# Patient Record
Sex: Male | Born: 1965
Health system: Western US, Academic
[De-identification: ages and names within clinical notes are randomized; demographics above are authoritative.]

---

## 2018-07-04 ENCOUNTER — Telehealth: Payer: Self-pay | Admitting: Internal Medicine

## 2018-07-04 DIAGNOSIS — Z524 Kidney donor: Principal | ICD-10-CM

## 2018-07-04 NOTE — Telephone Encounter (Signed)
Do you have a primary care doctor?   No   Do you have medical insurance?   No   When was your last physical examination?   couple of years   If you are over the age of 27, what is the approximate year of your last colonoscopy?   couple of years   Do you currently smoke?   No   If no, have you ever smoked?   Yes   If Yes, how much?   2 cigarrettes   How long?   long time ago   When did you quit?     *Do you take any prescribed medications every day including psychiatric medications? (Do not include vitamins)   No   If Yes please list your medications.     If you take medication for high blood pressure how long have you been taking it?     *Are you currently under the care of a doctor for any medical or psychiatric conditions?   No   If yes list the conditions followed by your doctor(s).     *Do you have diabetes?   No   *Do you currently smoke or chew tobacco?   No   *Have you ever had a blood clot in your legs or lungs?   No   *Have you ever had cancer including skin cancer?   No   If yes what type?     *Have you ever been diagnosed with hepatitis C?   No   *Have you ever had a stroke?   No   *Have you ever been admitted to the hospital for more than one night? (Please do not include childbirth)   Yes   Please list your hospital admissions   [facility] food poisoning.   *Are you allergic to iodine?   No   *Do you take protein supplements?   No   *Do you have sleep apnea or ever prescribed a CPAP machine?   No   What is your blood type?   I dont know   *Which country were you born in?   [place]   *Have you traveled to or lived in Public affairs [place] or [place] more than a short vacation as a tourist?   No   *Have you been to rural areas in [place] / Careers adviser or [place]?   No   *Have you had a pregnancy in the last 12 months?   No     Family History  Diabetes   No   High Blood Pressure   Yes   If yes, who?   mom   Kidney Disease   No   Bleeding Problems   No   Clotting Problems   No

## 2018-07-04 NOTE — Telephone Encounter (Signed)
Recipient is financially cleared per RN Evangeline Dakin ok to start donor testing, message Rivka [name] for initial ILDA call.

## 2018-07-06 NOTE — Telephone Encounter (Signed)
Initial ILDA interview    Per return call from patient, explained to donor that the ILDA function independently from the transplant candidate's team and that the role of Independent Living Donor Advocate Claudia Pollock) is to make sure that living donation is in your best interest and that you are making an informed choice about becoming a living donor.     Pt states he is interested in donating to a friend. Pt has known recipient for about 2 years.  He is only interested in being direct donor for recipient.  He has limited information about living donation.    Potential Donor states that decision to donate is voluntary and is aware that they can opt out and change their mind about decision to donate at any time.  Denies feeling any pressure or coercion.  Donor was informed that information remains confidential.  Donor is aware that recipient has other treatment options including dialysis or deceased donor transplantation.  Donor was informed about options of direct living donation or paired exchange.  Donor understands that transplant is a treatment and not a cure, and that recipient transplant could have potential surgical complications, transplant failure or even death. Living donor nephrectomy risks were discussed, including infection, surgical complications and death.  Donor aware that estimated hospital stay is 2 days and recovery time is 6 weeks post donation. Discussed donor's plan for time off work during recovery period. Donor is aware of need for support person through this process.  Donor is aware that donation may impact ability to obtain certain care insurance.  Donor is aware that it is illegal to accept payment in exchange for donation. Donor evaluation and testing process were reviewed.  Donor is aware that at any time donor may be disqualified from donating based on results from evaluation. Donor is aware that final approval as living donor is made by selection committee. Donor is aware that they can call  ILDA at any time if they have concerns or questions. Donor verbalized understanding of all topics discussed and has no unanswered questions at this time.     At this time donor would like to proceed with LD eval.  He was encouraged to review living donation emailed in the meantime.     ILDA has no concerns and this was communicated to the living donor coordinator.    Marc [name] [name] [name], LCSW  Licensed Clinical Civil Service fast streamer Living Donor Advocate    PI# [phone]  Office [phone]  Pager [phone]

## 2018-07-06 NOTE — Telephone Encounter (Signed)
Initial Independent Living Donor Advocate Contact Note    Attempted to reach patient to discuss application to be living kidney donor for a specific recipient.  Left voicemail message requesting for a return call.                                          Hyden Soley, LCSW  Licensed Clinical Social Worker  Independent Donor Advocate    PI# [id number]  Office [phone]  Pager [phone]

## 2018-07-11 NOTE — Telephone Encounter (Signed)
Per RN ready for phase 1 labs; Phase 1 instruction & [facility] lab location emailed to donor.

## 2018-07-27 NOTE — Telephone Encounter (Signed)
No phase 1 results; I emailed the donor to get an update, called but no answer vml.

## 2018-08-02 ENCOUNTER — Telehealth: Payer: Self-pay

## 2018-08-02 LAB — REFLEXIVE URINE CULTURE RFLX (EXTERNAL LAB)

## 2018-08-02 LAB — CULTURE, URINE, ROUTINE (EXTERNAL LAB)

## 2018-08-02 LAB — COMPREHENSIVE METABOLIC PANEL (EXTERNAL LAB)
ALT_Ext: 88 U/L — ABNORMAL HIGH (ref 9–46)
AST_Ext: 34 U/L (ref 10–35)
Albumin/Globulin Ratio_Ext: 1.6 (calc) (ref 1.0–2.5)
Albumin_Ext: 4.4 g/dL (ref 3.6–5.1)
Alkaline Phosphatase_Ext: 66 U/L (ref 35–144)
Bilirubin, Total_Ext: 0.4 mg/dL (ref 0.2–1.2)
Calcium_Ext: 9.6 mg/dL (ref 8.6–10.3)
Carbon Dioxide_Ext: 29 mmol/L (ref 20–32)
Chloride_Ext: 103 mmol/L (ref 98–110)
Creatinine_Ext: 0.79 mg/dL (ref 0.70–1.33)
EGFR African American_Ext: 118 mL/min/{1.73_m2} (ref >=60)
EGFR Non-Afr. American_Ext: 102 mL/min/{1.73_m2} (ref >=60)
Globulin_Ext: 2.7 g/dL (calc) (ref 1.9–3.7)
Glucose_Ext: 115 mg/dL — ABNORMAL HIGH (ref 65–99)
Potassium_Ext: 4.3 mmol/L (ref 3.5–5.3)
Protein, Total_Ext: 7.1 g/dL (ref 6.1–8.1)
Sodium_Ext: 140 mmol/L (ref 135–146)
Urea Nitrogen (BUN)_Ext: 15 mg/dL (ref 7–25)

## 2018-08-02 LAB — INSULIN (EXTERNAL LAB): Insulin_Ext: 28.5 u[IU]/mL — ABNORMAL HIGH

## 2018-08-02 LAB — CBC (INCLUDES DIFF/PLT) (EXTERNAL LAB)
Basophils % Auto: 0.6 %
Basophils Abs Auto (cells/uL): 30 cells/uL (ref 0–200)
Eosinophils % Auto: 10.2 %
Eosinophils Abs Auto (cells/uL): 510 cells/uL — ABNORMAL HIGH (ref 15–500)
Hematocrit: 48.2 % (ref 38.5–50.0)
Hemoglobin: 16.4 g/dL (ref 13.2–17.1)
Lymphocytes % Auto: 55.9 %
Lymphocytes Abs Auto (cells/uL): 2795 cells/uL (ref 850–3900)
MCH: 31.2 pg (ref 27.0–33.0)
MCHC g/dL: 34 g/dL (ref 32.0–36.0)
MCV: 91.6 fL (ref 80.0–100.0)
MPV: 11.3 fL (ref 7.5–12.5)
Monocytes % Auto: 9.6 %
Monocytes Abs Auto (cells/uL): 480 cells/uL (ref 200–950)
Neutrophils % Auto: 23.7 %
Neutrophils Abs Auto (cells/uL): 1185 cells/uL — ABNORMAL LOW (ref 1500–7800)
Platelet Count: 172 10*3/uL (ref 140–400)
RDW: 13.2 % (ref 11.0–15.0)
Red Blood Cell Count: 5.26 10*6/uL (ref 4.20–5.80)
White Blood Cell Count: 5 10*3/uL (ref 3.8–10.8)

## 2018-08-02 LAB — LIPID PANEL WITH RATIOS (EXTERNAL LAB)
Chol/HDLC Ratio_Ext: 3.9 (calc) (ref <5.0)
Cholesterol, Total_Ext: 175 mg/dL (ref <200)
HDL Cholesterol_Ext: 45 mg/dL (ref >=40)
LDL-Cholesterol_Ext: 112 mg/dL (calc) — ABNORMAL HIGH
LDL/HDL Ratio_Ext: 2.5 (calc)
Non HDL Cholesterol_Ext: 130 mg/dL (calc) — ABNORMAL HIGH (ref <130)
Triglycerides_Ext: 84 mg/dL (ref <150)

## 2018-08-02 LAB — URINALYSIS, COMPLETE W/RFL CULTURE (REFL) (EXTERNAL LAB)
Bilirubin_Ext: NEGATIVE
Glucose_Ext: NEGATIVE
Hyaline Cast_Ext: NONE SEEN /LPF
Ketones_Ext: NEGATIVE
Nitrite_Ext: NEGATIVE
Occult Blood_Ext: NEGATIVE
Protein_Ext: NEGATIVE
Specific Gravity_Ext: 1.016 (ref 1.001–1.035)
pH_Ext: 6.5 (ref 5.0–8.0)

## 2018-08-02 LAB — HEMOGLOBIN A1C WITH MPG (EXTERNAL LAB)
Hemoglobin A1C_Ext: 5.7 % of total Hgb — ABNORMAL HIGH (ref <5.7)
Mean Plasma Glucose_Ext: 126 mg/dL (calc)

## 2018-08-02 LAB — MICROALBUMIN, RANDOM URINE (W/CREATININE) (EXTERNAL LAB)
Creatinine, Random Urine_Ext: 84 mg/dL (ref 20–320)
Microalbumin/Creatinine Ratio, Random Urine_Ext: 11 mcg/mg creat (ref <30)
Microalbumin_Ext: 0.9 mg/dL

## 2018-08-02 LAB — URINE PROTEIN, TOTAL, RANDOM (W/O CREATININE) (EXTERNAL LAB): Protein, Total, Random Ur_Ext: 8 mg/dL (ref 5–25)

## 2018-08-02 LAB — ABO GROUP (EXTERNAL LAB)

## 2018-08-02 NOTE — Telephone Encounter (Signed)
I spoke with Marc Lucas (3 identifiers used and verified) and explained his labs indicate he COULD be pre diabetic and his ALT - liver enzyme is elevated. I let him know that I have his labs out for review with the MD and dieticians to see what improvements can be made. Explained this may rule him out but not quite sure yet. He stated understanding.   Evangeline Dakin, BSN, RN   Living Donor Transplant Coordinator  Phone: [phone]

## 2018-08-02 NOTE — Telephone Encounter (Signed)
Left a message for Joe to call back.  Aftin Lye, BSN, RN   Living Donor Transplant Coordinator  Phone: [phone]

## 2018-08-03 NOTE — Telephone Encounter (Signed)
-----   Message from Orvis Brill, MD sent at 08/03/2018  2:21 AM PDT -----  Regarding: RE: Please review phase one labs  Yes would have him follow PCP regarding impaired fasting glucose and elevated ALT.    Thanks  Minerva Areola   ----- Message -----  From: Evangeline Dakin, RN  Sent: 08/02/2018   9:45 AM PDT  To: Orvis Brill, MD, Lovina Reach, RD, #  Subject: Please review phase one labs                     HI- can you review phase one labs? They look like he is pre diabetic plus ALT is elevated. He is 5'9 and 180 pounds,.  I would think he needs to work on some things and come back but wanted your opinion.  Thanks  [employer]

## 2018-08-03 NOTE — Telephone Encounter (Signed)
Left a message for Joe to call back.  Evangeline Dakin, BSN, RN   Living Donor Transplant Coordinator  Phone: [phone]

## 2018-08-03 NOTE — Telephone Encounter (Signed)
-----   Message from Lovina Reach, RD sent at 08/02/2018 11:25 AM PDT -----  Regarding: RE: Please review phase one labs  [facility],     I agree that he should make some changes and come back. His labs are not acceptable. BMI is not high but he is Hispanic and therefore higher risk.     I would recommend weight loss to 165-170 lb (7-10%) then repeat phase 1 labs and if improved, bring him back.     Thanks,  Babs Sciara   ----- Message -----  From: Evangeline Dakin, RN  Sent: 08/02/2018   9:45 AM PDT  To: Orvis Brill, MD, Lovina Reach, RD, #  Subject: Please review phase one labs                     HI- can you review phase one labs? They look like he is pre diabetic plus ALT is elevated. He is 5'9 and 180 pounds,.  I would think he needs to work on some things and come back but wanted your opinion.  Thanks  [employer]

## 2018-08-10 NOTE — Telephone Encounter (Signed)
I contacted Marc Lucas and we discussed his phase one labs and recs from RD and Dr. Renaldo Reel and he stated understanding. He does have an appointment with his PCP coming up. I advised his PCP may say his labs are ok, but for [place] as the LD team, we need labs to be almost perfect and unfortunately due to him being genetic we would want to really ensure his risk of developing DM was very low. He stated understanding and was grateful we looked out for donors. He asked if he could come back, and I advised we would close his case for now but if he loses weight and gets his ALT down, we could repeat phase one labs and see about moving forward. He stated understanding.

## 2023-03-18 IMAGING — MR PELVE
6 series · 16 of 16 positions shown · non-contrast
Comparison: none

[Series 4: T2 · sagittal · 6.0mm · 0.68mm/px · 3 of 20 slices shown (1 of 3)]
[im 1/20]
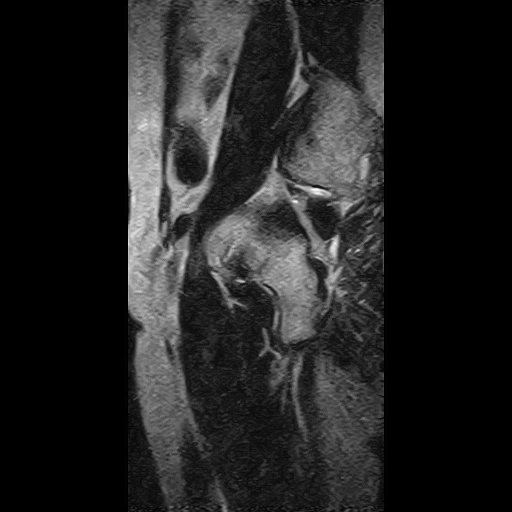
[im 10/20]
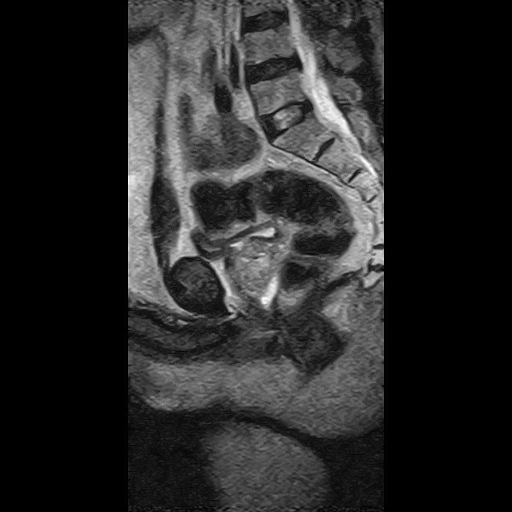
[im 20/20]
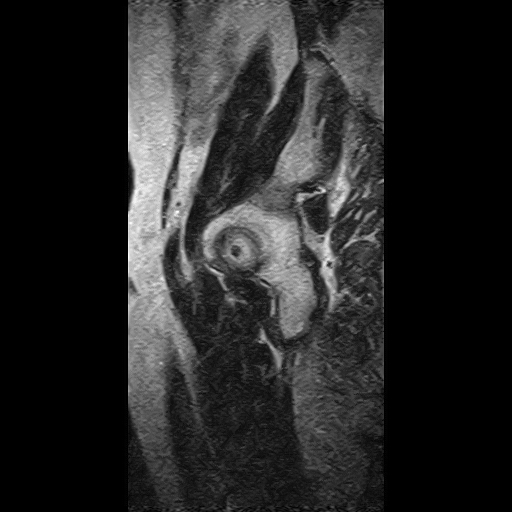

[Series 5: T2 · axial · 5.5mm · 0.68mm/px · z∈[-76,+67]mm · 3 of 20 slices shown (2 of 3)]
[im 1/20]
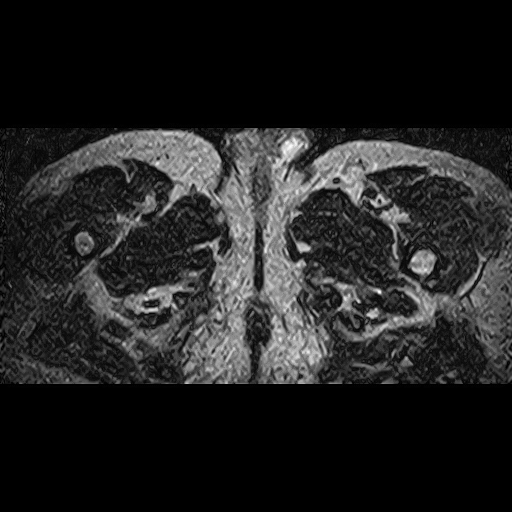
[im 10/20]
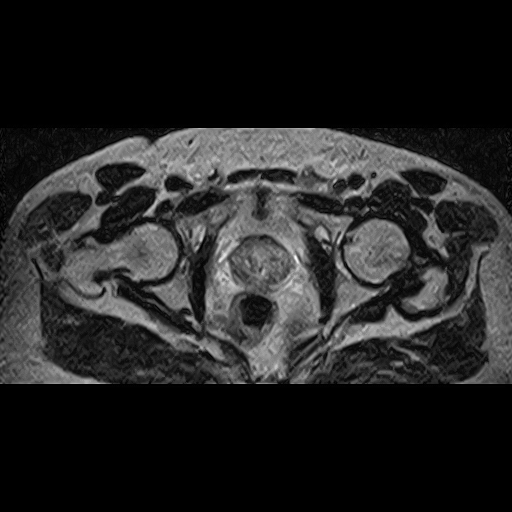
[im 20/20]
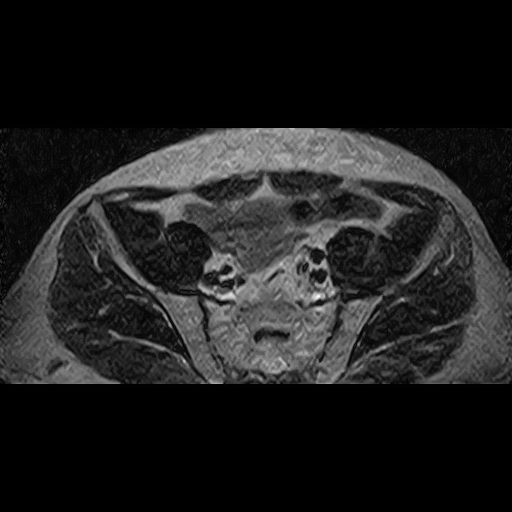

[Series 6: T1 · axial · 6.0mm · 0.68mm/px · z∈[-76,+67]mm · 2 of 20 slices shown (1 of 2)]
[im 1/20]
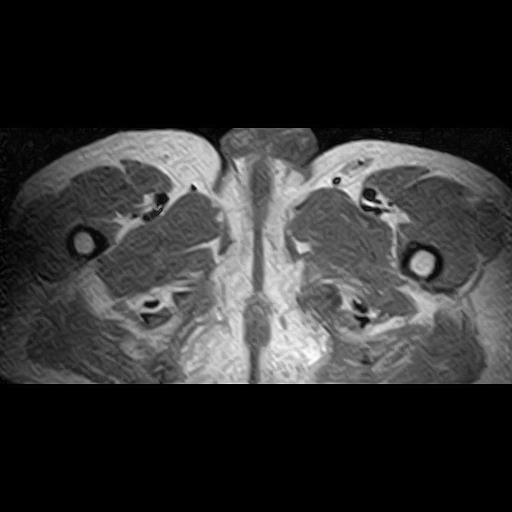
[im 20/20]
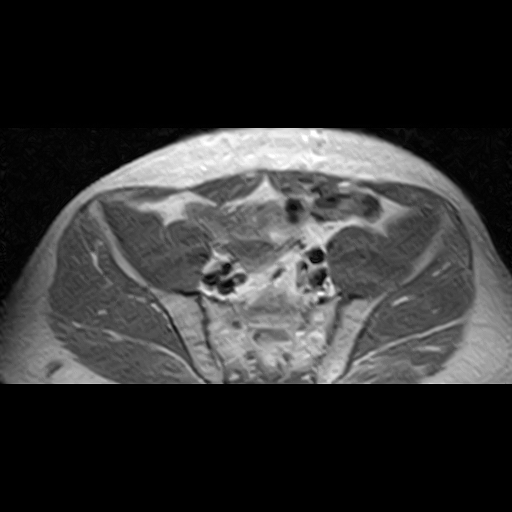

[Series 7: STIR · axial · 5.0mm · 0.68mm/px · z∈[-76,+67]mm · 2 of 20 slices shown]
[im 1/20]
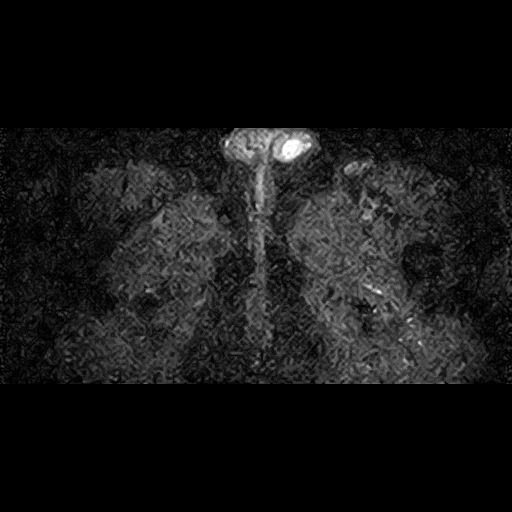
[im 20/20]
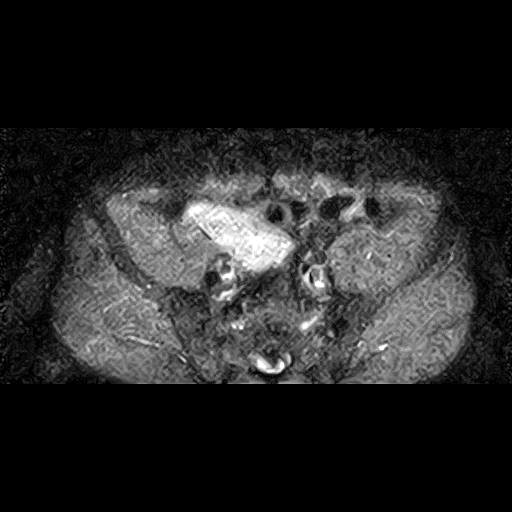

[Series 8: T1 · axial · 8.0mm · 0.68mm/px · z∈[-79,+71]mm · 4 of 32 slices shown (2 of 2)]
[im 1/32]
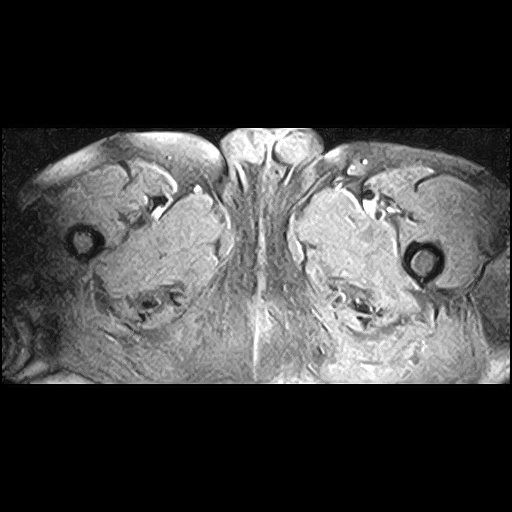
[im 11/32]
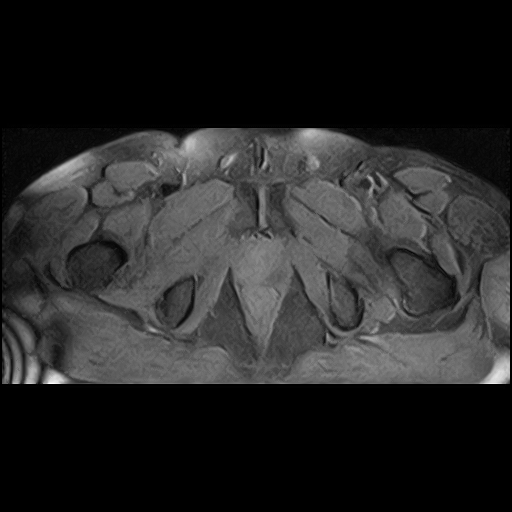
[im 21/32]
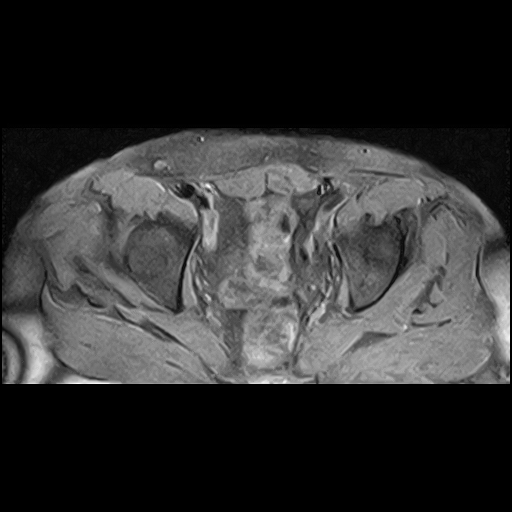
[im 32/32]
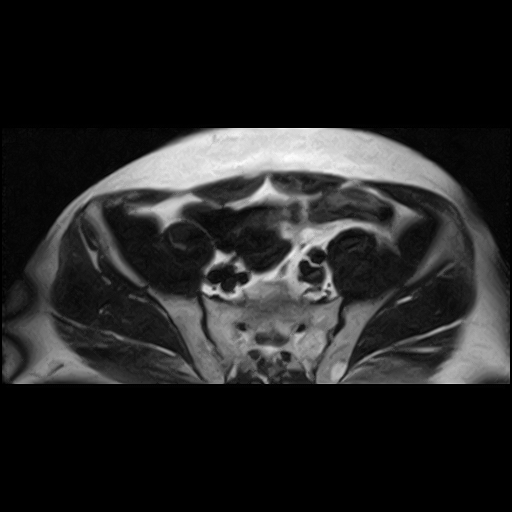

[Series 9: T2 · coronal · 8.0mm · 0.68mm/px · 2 of 20 slices shown (3 of 3)]
[im 1/20]
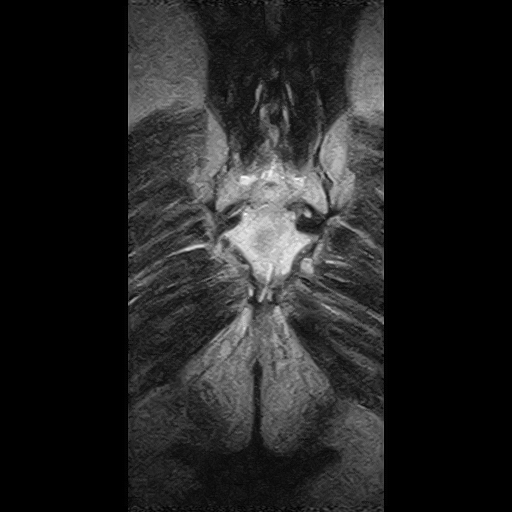
[im 20/20]
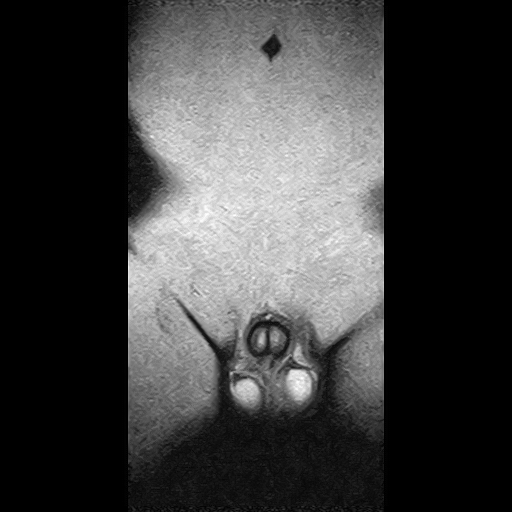

[16 of 16 positions shown; findings below may reference images not displayed]

Técnica:
Sequências multiplanares nas ponderações demonstradas, sem a administração venosa de meio de contraste.
Exame realizado em equipamento de baixo campo.
Análise:
Bexiga de paredes discretamente espessadas e conteúdo homogêneo.
Próstata de contornos lobulados, medindo 3,9 x 3,9 x 4,6 cm e pesando 34 g.
Sinais de hiperplasia difusa da zona de transição. Correlacionar com PSA.
Zona periférica com espessura discretamente reduzida.
Vesículas seminais anatômicas.
Ressonância Magnética da Pelve Masculina.
Não se individualizam linfonodomegalias pélvicas.
Ausência de evidências de líquido livre na pelve.
Reto inferior sem alterações de sinal detectáveis pelo método.
Vasos pélvicos de aspecto normal.
Fossas ísquiorretais livres.
Nota: Para aprofundamento diagóstico correlacionar com RM multiparamétrica em alto campo, a critério clínico.
